# Patient Record
Sex: Female | Born: 1960 | Race: White | Hispanic: No | Smoking: Never smoker
Health system: Southern US, Community
[De-identification: ages and names within clinical notes are randomized; demographics above are authoritative.]

## PROBLEM LIST (undated history)

## (undated) DIAGNOSIS — I10 Essential (primary) hypertension: Secondary | ICD-10-CM

## (undated) DIAGNOSIS — E119 Type 2 diabetes mellitus without complications: Principal | ICD-10-CM

## (undated) DIAGNOSIS — Z9119 Patient's noncompliance with other medical treatment and regimen: Secondary | ICD-10-CM

## (undated) DIAGNOSIS — J3089 Other allergic rhinitis: Secondary | ICD-10-CM

## (undated) DIAGNOSIS — T7840XA Allergy, unspecified, initial encounter: Secondary | ICD-10-CM

## (undated) DIAGNOSIS — E785 Hyperlipidemia, unspecified: Secondary | ICD-10-CM

## (undated) DIAGNOSIS — G709 Myoneural disorder, unspecified: Secondary | ICD-10-CM

## (undated) HISTORY — DX: Type 2 diabetes mellitus without complications: E11.9

## (undated) HISTORY — PX: CLEFT PALATE REPAIR: SUR1165

## (undated) HISTORY — DX: Patient's noncompliance with other medical treatment and regimen: Z91.19

## (undated) HISTORY — DX: Myoneural disorder, unspecified: G70.9

## (undated) HISTORY — PX: NASAL SEPTUM SURGERY: SHX37

## (undated) HISTORY — PX: TONSILLECTOMY AND ADENOIDECTOMY: SUR1326

## (undated) HISTORY — DX: Other allergic rhinitis: J30.89

## (undated) HISTORY — PX: CHOLECYSTECTOMY: SHX55

## (undated) HISTORY — DX: Morbid (severe) obesity due to excess calories: E66.01

## (undated) HISTORY — PX: TYMPANOSTOMY TUBE PLACEMENT: SHX32

## (undated) HISTORY — DX: Hyperlipidemia, unspecified: E78.5

## (undated) HISTORY — DX: Essential (primary) hypertension: I10

## (undated) HISTORY — DX: Allergy, unspecified, initial encounter: T78.40XA

---

## 1998-07-11 ENCOUNTER — Encounter: Payer: Self-pay | Admitting: General Surgery

## 1998-07-11 ENCOUNTER — Observation Stay (HOSPITAL_COMMUNITY): Admission: RE | Admit: 1998-07-11 | Discharge: 1998-07-12 | Payer: Self-pay | Admitting: General Surgery

## 1999-08-24 ENCOUNTER — Other Ambulatory Visit: Admission: RE | Admit: 1999-08-24 | Discharge: 1999-08-24 | Payer: Self-pay | Admitting: *Deleted

## 2001-03-14 ENCOUNTER — Encounter: Admission: RE | Admit: 2001-03-14 | Discharge: 2001-03-14 | Payer: Self-pay | Admitting: *Deleted

## 2001-03-14 ENCOUNTER — Encounter: Payer: Self-pay | Admitting: *Deleted

## 2002-01-19 ENCOUNTER — Other Ambulatory Visit: Admission: RE | Admit: 2002-01-19 | Discharge: 2002-01-19 | Payer: Self-pay | Admitting: *Deleted

## 2002-04-29 ENCOUNTER — Encounter: Admission: RE | Admit: 2002-04-29 | Discharge: 2002-04-29 | Payer: Self-pay | Admitting: *Deleted

## 2002-04-29 ENCOUNTER — Encounter: Payer: Self-pay | Admitting: *Deleted

## 2004-07-19 ENCOUNTER — Encounter: Admission: RE | Admit: 2004-07-19 | Discharge: 2004-07-19 | Payer: Self-pay | Admitting: Obstetrics and Gynecology

## 2007-05-29 ENCOUNTER — Encounter: Admission: RE | Admit: 2007-05-29 | Discharge: 2007-05-29 | Payer: Self-pay | Admitting: Family Medicine

## 2009-04-22 ENCOUNTER — Encounter: Admission: RE | Admit: 2009-04-22 | Discharge: 2009-04-22 | Payer: Self-pay | Admitting: Family Medicine

## 2010-05-23 ENCOUNTER — Encounter: Admission: RE | Admit: 2010-05-23 | Discharge: 2010-05-23 | Payer: Self-pay | Admitting: Family Medicine

## 2012-03-26 ENCOUNTER — Other Ambulatory Visit: Payer: Self-pay

## 2012-03-26 ENCOUNTER — Other Ambulatory Visit (HOSPITAL_COMMUNITY)
Admission: RE | Admit: 2012-03-26 | Discharge: 2012-03-26 | Disposition: A | Discharge status: Home / Self Care | Payer: 59 | Source: Ambulatory Visit

## 2012-03-26 DIAGNOSIS — Z1151 Encounter for screening for human papillomavirus (HPV): Secondary | ICD-10-CM | POA: Insufficient documentation

## 2012-03-26 DIAGNOSIS — Z124 Encounter for screening for malignant neoplasm of cervix: Secondary | ICD-10-CM | POA: Insufficient documentation

## 2012-03-26 LAB — HM PAP SMEAR: HM Pap smear: NEGATIVE

## 2012-10-10 ENCOUNTER — Ambulatory Visit
Admission: RE | Admit: 2012-10-10 | Discharge: 2012-10-10 | Disposition: A | Discharge status: Home / Self Care | Payer: 59 | Source: Ambulatory Visit

## 2012-10-10 ENCOUNTER — Other Ambulatory Visit: Payer: Self-pay

## 2012-10-10 DIAGNOSIS — S99922A Unspecified injury of left foot, initial encounter: Secondary | ICD-10-CM

## 2013-04-14 ENCOUNTER — Other Ambulatory Visit: Payer: Self-pay

## 2013-04-14 DIAGNOSIS — Z1231 Encounter for screening mammogram for malignant neoplasm of breast: Secondary | ICD-10-CM

## 2013-04-16 ENCOUNTER — Ambulatory Visit (INDEPENDENT_AMBULATORY_CARE_PROVIDER_SITE_OTHER): Payer: 59 | Admitting: Podiatry

## 2013-04-16 ENCOUNTER — Encounter: Payer: Self-pay | Admitting: Podiatry

## 2013-04-16 VITALS — BP 148/84 | HR 90 | Resp 12 | Ht 61.0 in | Wt 201.0 lb

## 2013-04-16 DIAGNOSIS — L84 Corns and callosities: Secondary | ICD-10-CM

## 2013-04-16 NOTE — Progress Notes (Signed)
N- THICK SKIN  L- RT FOOT D- 3 WEEKS O- SUDDENLY C- WORSE A- PRESSURE T- N/A

## 2013-04-17 NOTE — Progress Notes (Signed)
Subjective:     Patient ID: Tammy Holloway, female   DOB: 1960/09/29, 52 y.o.   MRN: [id number]  HPI patient has lesions on the bottom of the right foot that are sore and she cannot take care of herself   Review of Systems     Objective:   Physical Exam  Nursing note and vitals reviewed. Constitutional: She is oriented to person, place, and time.  Cardiovascular: Intact distal pulses.   Musculoskeletal: Normal range of motion.  Neurological: She is oriented to person, place, and time.  Skin: Skin is warm.   Lesion formation both feet secondary to bone trama    Assessment:    lesions of both feet present    Plan:     Debridement of lesions both feet with no iatrogenic bleeding noted

## 2013-05-15 ENCOUNTER — Ambulatory Visit: Payer: 59

## 2013-05-19 ENCOUNTER — Ambulatory Visit
Admission: RE | Admit: 2013-05-19 | Discharge: 2013-05-19 | Disposition: A | Discharge status: Home / Self Care | Payer: 59 | Source: Ambulatory Visit

## 2013-05-19 DIAGNOSIS — Z1231 Encounter for screening mammogram for malignant neoplasm of breast: Secondary | ICD-10-CM

## 2013-06-30 LAB — MICROALBUMIN, URINE: Microalb, Ur: 1.1

## 2014-05-26 ENCOUNTER — Other Ambulatory Visit: Payer: Self-pay

## 2014-05-26 DIAGNOSIS — Z1231 Encounter for screening mammogram for malignant neoplasm of breast: Secondary | ICD-10-CM

## 2014-05-28 ENCOUNTER — Ambulatory Visit
Admission: RE | Admit: 2014-05-28 | Discharge: 2014-05-28 | Disposition: A | Discharge status: Home / Self Care | Payer: 59 | Source: Ambulatory Visit

## 2014-05-28 DIAGNOSIS — Z1231 Encounter for screening mammogram for malignant neoplasm of breast: Secondary | ICD-10-CM

## 2014-08-24 ENCOUNTER — Encounter: Payer: Self-pay | Admitting: Podiatry

## 2014-08-24 ENCOUNTER — Ambulatory Visit (INDEPENDENT_AMBULATORY_CARE_PROVIDER_SITE_OTHER): Payer: 59 | Admitting: Podiatry

## 2014-08-24 DIAGNOSIS — L84 Corns and callosities: Secondary | ICD-10-CM

## 2014-08-25 NOTE — Progress Notes (Signed)
Subjective:     Patient ID: Tammy Holloway, female   DOB: [birth year], 54 y.o.   MRN: [id number]  HPI patient presents with severe lesions plantar aspect right foot that have been chronic and responding well to trimming   Review of Systems     Objective:   Physical Exam Neurovascular status intact with keratotic lesion sub-metatarsal right foot that's painful when pressed    Assessment:     Lesion secondary to bone pressure    Plan:     Debride painful lesions plantar aspect right with no iatrogenic bleeding noted

## 2014-11-04 LAB — BASIC METABOLIC PANEL: POTASSIUM: 4.6 mmol/L (ref 3.4–5.3)

## 2015-05-03 ENCOUNTER — Other Ambulatory Visit: Payer: Self-pay

## 2015-05-03 DIAGNOSIS — Z1231 Encounter for screening mammogram for malignant neoplasm of breast: Secondary | ICD-10-CM

## 2015-05-03 LAB — BASIC METABOLIC PANEL
BUN: 17 mg/dL (ref 4–21)
CREATININE: 0.7 mg/dL (ref <=1.1)
GLUCOSE: 131 mg/dL

## 2015-05-03 LAB — LIPID PANEL
Cholesterol: 209 mg/dL — AB (ref 0–200)
HDL: 54 mg/dL (ref 35–70)
LDL Cholesterol: 138 mg/dL
TRIGLYCERIDES: 87 mg/dL (ref 40–160)

## 2015-05-03 LAB — HEMOGLOBIN A1C: Hemoglobin A1C: 6.7

## 2015-06-07 ENCOUNTER — Ambulatory Visit
Admission: RE | Admit: 2015-06-07 | Discharge: 2015-06-07 | Disposition: A | Discharge status: Home / Self Care | Payer: 59 | Source: Ambulatory Visit

## 2015-06-07 DIAGNOSIS — Z1231 Encounter for screening mammogram for malignant neoplasm of breast: Secondary | ICD-10-CM

## 2015-12-28 ENCOUNTER — Ambulatory Visit (INDEPENDENT_AMBULATORY_CARE_PROVIDER_SITE_OTHER): Payer: 59 | Admitting: Family Medicine

## 2015-12-28 ENCOUNTER — Encounter: Payer: Self-pay | Admitting: Family Medicine

## 2015-12-28 VITALS — BP 131/81 | HR 76 | Ht 60.5 in | Wt 201.9 lb

## 2015-12-28 DIAGNOSIS — J3089 Other allergic rhinitis: Secondary | ICD-10-CM

## 2015-12-28 DIAGNOSIS — IMO0001 Reserved for inherently not codable concepts without codable children: Secondary | ICD-10-CM

## 2015-12-28 DIAGNOSIS — Z8249 Family history of ischemic heart disease and other diseases of the circulatory system: Secondary | ICD-10-CM

## 2015-12-28 DIAGNOSIS — E119 Type 2 diabetes mellitus without complications: Secondary | ICD-10-CM

## 2015-12-28 DIAGNOSIS — M609 Myositis, unspecified: Secondary | ICD-10-CM

## 2015-12-28 DIAGNOSIS — E559 Vitamin D deficiency, unspecified: Secondary | ICD-10-CM | POA: Diagnosis not present

## 2015-12-28 DIAGNOSIS — Z8342 Family history of familial hypercholesterolemia: Secondary | ICD-10-CM

## 2015-12-28 DIAGNOSIS — Z823 Family history of stroke: Secondary | ICD-10-CM

## 2015-12-28 DIAGNOSIS — Z833 Family history of diabetes mellitus: Secondary | ICD-10-CM

## 2015-12-28 DIAGNOSIS — M791 Myalgia: Secondary | ICD-10-CM

## 2015-12-28 HISTORY — DX: Other allergic rhinitis: J30.89

## 2015-12-28 MED ORDER — GLIPIZIDE 5 MG PO TABS: 5.0000 mg | ORAL_TABLET | Freq: Two times a day (BID) | ORAL | Status: AC

## 2015-12-28 MED ORDER — METFORMIN HCL ER 500 MG PO TB24: 1000.0000 mg | ORAL_TABLET | Freq: Every day | ORAL | Status: AC

## 2015-12-28 NOTE — Patient Instructions (Addendum)
FBS- every morning before meals it should be under 126 then you can check your 2 hours after largest meal the day and that should be under 180.   Keep a log and write it down and bring in each office visit  Obtain fasting blood work in near future at your convenience and then have a follow-up office visit with me within 5-7 days      Complementary and Alternative Medical Therapies for Diabetes Complementary and alternative medicines are health care practices or products that are not always accepted as part of routine medicine. Complementary medicine is used along with routine medicine (medical therapy). Alternative medicine can sometimes be used instead of routine medicine. Some people use these methods to treat diabetes. While some of these therapies may be effective, others may not be. Some may even be harmful. Patients using these methods need to tell their caregiver. It is important to let your caregivers know what you are doing. Some of these therapies are discussed below. For more information, talk with your caregiver. THERAPIES Acupuncture Acupuncture is done by a professional who inserts needles into certain points on the skin. Some scientists believe that this triggers the release of the body's natural painkillers. It has been shown to relieve long-term (chronic) pain. This may help patients with painful nerve damage caused by diabetes. Biofeedback Biofeedback helps a person become more aware of the body's response to pain. It also helps you learn to deal with the pain. This alternative therapy focuses on relaxation and stress-reduction techniques. Thinking of peaceful mental images (guided imagery) is one technique. Some people believe these images can ease their condition. MEDICATIONS Chromium Several studies report that chromium supplements may improve diabetes control. Chromium helps insulin improve its action. Research is not yet certain. Supplements have not been recommended or  approved. Caution is needed if you have kidney (renal) problems. Ginseng There are several types of ginseng plants. American ginseng is used for diabetes studies. Those studies have shown some glucose-lowering effects. Those effects have been seen with fasting and after-meal blood glucose levels. They have also been seen in A1c levels (average blood glucose levels over a 36-month period). More long-term studies are needed before recommendations for use of ginseng can be made. Magnesium Experts have studied the relationship between magnesium and diabetes for many years. But it is not yet fully understood. Studies suggest that a low amount of magnesium may make blood glucose control worse in type 2 diabetes. Research also shows that a low amount may contribute to certain diabetes complications. One study showed that people who consume more magnesium had less risk of type 2 diabetes. Eating whole grains, nuts, and green leafy vegetables raises the magnesium level. Vanadium Vanadium is a compound found in tiny amounts in plants and animals. Early studies showed that vanadium improved blood glucose levels in animals with type 1 and type 2 diabetes. One study found that when given vanadium, those with diabetes were able to decrease their insulin dosage. Researchers still need to learn how it works in the body to discover any side effects, and to find safe dosages. Cinnamon There have been a couple of studies that seem to indicate cinnamon decreases insulin resistance and increases insulin production. By doing so, it may lower blood glucose. Exact doses are unknown, but it may work best when used in combination with other diabetes medicines.   This information is not intended to replace advice given to you by your health care provider. Make sure you discuss any  questions you have with your health care provider.   Document Released: 04/01/2007 Document Revised: 08/27/2011 Document Reviewed: 04/14/2009 Elsevier  Interactive Patient Education 2016 [employer]       Diabetes and Exercise Exercising regularly is important. It is not just about losing weight. It has many health benefits, such as:  Improving your overall fitness, flexibility, and endurance.  Increasing your bone density.  Helping with weight control.  Decreasing your body fat.  Increasing your muscle strength.  Reducing stress and tension.  Improving your overall health. People with diabetes who exercise gain additional benefits because exercise:  Reduces appetite.  Improves the body's use of blood sugar (glucose).  Helps lower or control blood glucose.  Decreases blood pressure.  Helps control blood lipids (such as cholesterol and triglycerides).  Improves the body's use of the hormone insulin by:  Increasing the body's insulin sensitivity.  Reducing the body's insulin needs.  Decreases the risk for heart disease because exercising:  Lowers cholesterol and triglycerides levels.  Increases the levels of good cholesterol (such as high-density lipoproteins [HDL]) in the body.  Lowers blood glucose levels. YOUR ACTIVITY PLAN  Choose an activity that you enjoy, and set realistic goals. To exercise safely, you should begin practicing any new physical activity slowly, and gradually increase the intensity of the exercise over time. Your health care provider or diabetes educator can help create an activity plan that works for you. General recommendations include:  Encouraging children to engage in at least 60 minutes of physical activity each day.  Stretching and performing strength training exercises, such as yoga or weight lifting, at least 2 times per week.  Performing a total of at least 150 minutes of moderate-intensity exercise each week, such as brisk walking or water aerobics.  Exercising at least 3 days per week, making sure you allow no more than 2 consecutive days to pass without  exercising.  Avoiding long periods of inactivity (90 minutes or more). When you have to spend an extended period of time sitting down, take frequent breaks to walk or stretch. RECOMMENDATIONS FOR EXERCISING WITH TYPE 1 OR TYPE 2 DIABETES   Check your blood glucose before exercising. If blood glucose levels are greater than 240 mg/dL, check for urine ketones. Do not exercise if ketones are present.  Avoid injecting insulin into areas of the body that are going to be exercised. For example, avoid injecting insulin into:  The arms when playing tennis.  The legs when jogging.  Keep a record of:  Food intake before and after you exercise.  Expected peak times of insulin action.  Blood glucose levels before and after you exercise.  The type and amount of exercise you have done.  Review your records with your health care provider. Your health care provider will help you to develop guidelines for adjusting food intake and insulin amounts before and after exercising.  If you take insulin or oral hypoglycemic agents, watch for signs and symptoms of hypoglycemia. They include:  Dizziness.  Shaking.  Sweating.  Chills.  Confusion.  Drink plenty of water while you exercise to prevent dehydration or heat stroke. Body water is lost during exercise and must be replaced.  Talk to your health care provider before starting an exercise program to make sure it is safe for you. Remember, almost any type of activity is better than none.   This information is not intended to replace advice given to you by your health care provider. Make sure you discuss any questions  you have with your health care provider.   Document Released: 08/25/2003 Document Revised: 10/19/2014 Document Reviewed: 11/11/2012 Elsevier Interactive Patient Education [employer]

## 2015-12-28 NOTE — Progress Notes (Signed)
Carlye Grippe, D.O. Primary care at [facility]   Subjective:    Chief Complaint  Patient presents with  . Establish Care   New pt, here to establish care.   HPI: Tammy Holloway is a pleasant 55 y.o. female who presents to [facility] at [facility] today to become established. Patient's prior PCP was Dr Farris Has of [facility] [place].   She also had tried to go see a "holistic practitioner" Becky Augusta, MD.   Patient admittedly would like to treat diseases through natural herbs and essential oils. But states that Dr. Asencion Islam was too expensive and did not know enough about essential oils/herbs and patient could not continue to go.  Patient was born in [place] and recently moved here from [place].   Been under a lot of stress lately.  Her dad died in 2022-11-23 and she did going up to [place] to see her mom a lot. No suicidal or homicidal ideations.    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 She states that working out however makes her feel weak and worsens her energy level.  She was told by an orthopedic or other sports medicine type physician in the past to avoid exercise or working out at all because she "caused so much damage to her joints and muscles" when she body built so hard in her 54s, that now her body can't take it.  Patient went off thyroid medicines couple years ago because she takes natural herbs of lemongrass and others.  DM for 11-12 yrs.   does not really check her sugars much.  She feels medications are bad for her and that they cause her to feel nauseated and gives her loose stools.  History of medical noncompliance     Past Medical History  Diagnosis Date  . Allergy   . Neuromuscular disorder [facility])       Past Surgical History  Procedure Laterality Date  . Tympanostomy tube placement    . Cleft palate repair    .  Tonsillectomy and adenoidectomy    . Nasal septum surgery    . Cholecystectomy        Family History  Problem Relation Age of Onset  . Hypertension Mother   . COPD Mother   . Alcohol abuse Father   . Cancer Father     esophageal  . Heart attack Father   . Diabetes Father   . Hyperlipidemia Father   . Stroke Father   . Cancer Paternal Grandmother     skin  . Heart attack Paternal Grandmother   . Stroke Paternal Grandmother       History  Drug Use No  ,    History  Alcohol Use No  ,    History  Smoking status  . Never Smoker   Smokeless tobacco  . Never Used  ,     History  Sexual Activity  . Sexual Activity: Yes  . Birth Control/ Protection: None      Patient's Medications  New Prescriptions   No medications on file  Previous Medications   ACTIPHYTE OF LEMONGRASS LIQD    12 drops by Does not apply route daily.   CHOLECALCIFEROL (VITAMIN D) 1000 UNITS TABLET    Take 2,000 Units by mouth daily.   COCONUT OIL PO    Take 1  application by mouth daily.   FLUOCINONIDE-EMOLLIENT (LIDEX-E) 0.05 % CREAM    Apply topically 2 (two) times daily.   GLIPIZIDE (GLUCOTROL) 5 MG TABLET    Take 5 mg by mouth 2 (two) times daily before a meal.   METFORMIN (GLUCOPHAGE) 500 MG TABLET    Take 2 tablets by mouth 2 (two) times daily.   PROBIOTIC PRODUCT (PROBIOTIC DAILY PO)    Take 1 tablet by mouth daily.  Modified Medications   No medications on file  Discontinued Medications   METFORMIN (GLUCOPHAGE) 500 MG TABLET    Take 500 mg by mouth 2 (two) times daily with a meal.     Latex and Volmax   Immunization History  Administered Date(s) Administered  . Tdap 05/03/2015   Review of Systems:   ( Completed via Adult Medical History Intake form today ) General:   Denies fever, chills, appetite changes, unexplained weight loss.  Optho/Auditory:   Denies visual changes, blurred vision/LOV, ringing in ears/ diff hearing Respiratory:   Denies SOB, DOE, cough, wheezing.    Cardiovascular:   Denies chest pain, palpitations, new onset peripheral edema  Gastrointestinal:   Denies nausea, vomiting, diarrhea.  Genitourinary:    Denies dysuria, increased frequency, flank pain.  Endocrine:     Denies hot or cold intolerance, polyuria, polydipsia. Musculoskeletal:  Denies unexplained myalgias, joint swelling, arthralgias, gait problems.  Skin:  Denies rash, suspicious lesions or new/ changes in moles Neurological:    Denies dizziness, syncope, unexplained weakness, lightheadedness, numbness  Psychiatric/Behavioral:   Denies mood changes, suicidal or homicidal ideations, hallucinations    Objective:   Blood pressure 131/81, pulse 76, height 5' 0.5" (1.537 m), weight 201 lb 14.4 oz (91.581 kg). Body mass index is 38.77 kg/(m^2).  General: Well Developed, well nourished, and in no acute distress.  Neuro: Alert and oriented x3, extra-ocular muscles intact, sensation grossly intact.  HEENT: Normocephalic, atraumatic, pupils equal round reactive to light, neck supple, no gross masses, no carotid bruits, no JVD apprec Skin: no gross suspicious lesions or rashes  Cardiac: Regular rate and rhythm, no murmurs rubs or gallops.  Respiratory: Essentially clear to auscultation bilaterally. Not using accessory muscles, speaking in full sentences.  Abdominal: Soft, not grossly distended Musculoskeletal: Ambulates w/o diff, FROM * 4 ext.  Vasc: less 2 sec cap RF, warm and pink  Psych:  No HI/SI, judgement and insight good.    Impression and Recommendations:    1. Diabetes mellitus without complication (HCC)   2. Morbid obesity, unspecified obesity type (HCC)   3. Generalized myalgias and myositis   4. Vitamin D deficiency   5. Environmental and seasonal allergies   6. Family history of heart attack ( F, GF)   7. Family history of diabetes mellitus in father   24. Family history of stroke or transient ischemic attack in father   27. Family history of high cholesterol (F)    10. Family history of hypertension in mother    FBS- every morning before meals it should be under 126 then you can check your 2 hours after largest meal the day and that should be under 180.   Keep a log and write it down and bring in each office visit  Obtain fasting blood work in near future at your convenience and then have a follow-up office visit with me within 5-7 days  Made it clear to patient that I did not specialize in complementary or alternative medicine but I am more than happy to try  to work with her.  I explained my knowledge of using essential oils for treatment of chronic disease is null, and that I would not have time to do research for her.  I did advise her to go online to look for these types of physicians in the area if she would so choose.     Pt was in the office today for 40+ minutes, with over 50% time spent in face to face counseling of various medical concerns and in coordination of care  The patient was counseled, risk factors were discussed, anticipatory guidance given.  Gross side effects, risk and benefits, and alternatives of medications discussed with patient.  Patient is aware that all medications have potential side effects and we are unable to predict every side effect or drug-drug interaction that may occur.  Expresses verbal understanding and consents to current therapy plan and treatment regimen.  Please see AVS handed out to patient at the end of our visit for further patient instructions/ counseling done pertaining to today's office visit.    Note: This document was prepared using Dragon voice recognition software and may include unintentional dictation errors.

## 2015-12-30 ENCOUNTER — Other Ambulatory Visit (INDEPENDENT_AMBULATORY_CARE_PROVIDER_SITE_OTHER): Payer: 59

## 2015-12-30 DIAGNOSIS — Z0000 Encounter for general adult medical examination without abnormal findings: Secondary | ICD-10-CM

## 2015-12-30 DIAGNOSIS — E119 Type 2 diabetes mellitus without complications: Secondary | ICD-10-CM | POA: Diagnosis not present

## 2015-12-31 LAB — LIPID PANEL
CHOLESTEROL: 191 mg/dL (ref 125–200)
HDL: 49 mg/dL (ref >=46)
LDL Cholesterol: 121 mg/dL (ref <130)
TRIGLYCERIDES: 104 mg/dL (ref <150)
Total CHOL/HDL Ratio: 3.9 Ratio (ref <=5.0)
VLDL: 21 mg/dL (ref <30)

## 2015-12-31 LAB — CBC WITH DIFFERENTIAL/PLATELET
BASOS ABS: 0 {cells}/uL (ref 0–200)
Basophils Relative: 0 %
EOS PCT: 2 %
Eosinophils Absolute: 138 cells/uL (ref 15–500)
HCT: 41.7 % (ref 35.0–45.0)
HEMOGLOBIN: 13.8 g/dL (ref 11.7–15.5)
LYMPHS PCT: 22 %
Lymphs Abs: 1518 cells/uL (ref 850–3900)
MCH: 28.2 pg (ref 27.0–33.0)
MCHC: 33.1 g/dL (ref 32.0–36.0)
MCV: 85.3 fL (ref 80.0–100.0)
MPV: 9.6 fL (ref 7.5–12.5)
Monocytes Absolute: 690 cells/uL (ref 200–950)
Monocytes Relative: 10 %
NEUTROS PCT: 66 %
Neutro Abs: 4554 cells/uL (ref 1500–7800)
Platelets: 274 10*3/uL (ref 140–400)
RBC: 4.89 MIL/uL (ref 3.80–5.10)
RDW: 14.2 % (ref 11.0–15.0)
WBC: 6.9 10*3/uL (ref 3.8–10.8)

## 2015-12-31 LAB — COMPREHENSIVE METABOLIC PANEL
ALBUMIN: 4 g/dL (ref 3.6–5.1)
ALT: 19 U/L (ref 6–29)
AST: 14 U/L (ref 10–35)
Alkaline Phosphatase: 87 U/L (ref 33–130)
BILIRUBIN TOTAL: 0.6 mg/dL (ref 0.2–1.2)
BUN: 14 mg/dL (ref 7–25)
CALCIUM: 8.8 mg/dL (ref 8.6–10.4)
CHLORIDE: 104 mmol/L (ref 98–110)
CO2: 28 mmol/L (ref 20–31)
CREATININE: 0.65 mg/dL (ref 0.50–1.05)
GLUCOSE: 158 mg/dL — AB (ref 65–99)
Potassium: 4.7 mmol/L (ref 3.5–5.3)
SODIUM: 140 mmol/L (ref 135–146)
Total Protein: 6.3 g/dL (ref 6.1–8.1)

## 2015-12-31 LAB — TSH: TSH: 2.13 mIU/L

## 2015-12-31 LAB — HEMOGLOBIN A1C
HEMOGLOBIN A1C: 7.4 % — AB (ref <5.7)
MEAN PLASMA GLUCOSE: 166 mg/dL

## 2015-12-31 LAB — VITAMIN D 25 HYDROXY (VIT D DEFICIENCY, FRACTURES): Vit D, 25-Hydroxy: 35 ng/mL (ref 30–100)

## 2016-01-06 ENCOUNTER — Ambulatory Visit (INDEPENDENT_AMBULATORY_CARE_PROVIDER_SITE_OTHER): Payer: 59 | Admitting: Family Medicine

## 2016-01-06 ENCOUNTER — Encounter: Payer: Self-pay | Admitting: Family Medicine

## 2016-01-06 VITALS — BP 136/83 | HR 68 | Resp 15 | Wt 203.0 lb

## 2016-01-06 DIAGNOSIS — E119 Type 2 diabetes mellitus without complications: Secondary | ICD-10-CM | POA: Diagnosis not present

## 2016-01-06 DIAGNOSIS — E785 Hyperlipidemia, unspecified: Secondary | ICD-10-CM | POA: Diagnosis not present

## 2016-01-06 DIAGNOSIS — Z91199 Patient's noncompliance with other medical treatment and regimen due to unspecified reason: Secondary | ICD-10-CM | POA: Insufficient documentation

## 2016-01-06 DIAGNOSIS — I10 Essential (primary) hypertension: Secondary | ICD-10-CM

## 2016-01-06 DIAGNOSIS — Z9119 Patient's noncompliance with other medical treatment and regimen: Secondary | ICD-10-CM

## 2016-01-06 HISTORY — DX: Essential (primary) hypertension: I10

## 2016-01-06 HISTORY — DX: Patient's noncompliance with other medical treatment and regimen: Z91.19

## 2016-01-06 HISTORY — DX: Type 2 diabetes mellitus without complications: E11.9

## 2016-01-06 HISTORY — DX: Hyperlipidemia, unspecified: E78.5

## 2016-01-06 HISTORY — DX: Patient's noncompliance with other medical treatment and regimen due to unspecified reason: Z91.199

## 2016-01-06 HISTORY — DX: Morbid (severe) obesity due to excess calories: E66.01

## 2016-01-06 NOTE — Progress Notes (Signed)
Subjective:    Chief Complaint  Patient presents with  . Follow-up    HPI: Tammy Holloway is a 55 y.o. female who presents to [facility] at [facility] today to discuss labs and her plan on how to treat her diseases naturally through oils/herbs etc.    She has a history of diabetes and admittedly, has not been eating right or exercising.  She brings me in a piece of paper with fasting blood sugars and 2 hour postprandials. Her fasting blood sugars run in the 130s to 140s. Her 2 hour postprandials 180s to 190s. She denies any symptoms polyuria polydipsia, numbness tingling, visual problems.   She does not want to go up on any of her meds today, or start any new ones and wants me to work with her she can try self treatment through "natural" ways. She brings in a bunch of sheets on essential oils and they're therapeutic properties as well as articles on essential oils she would like me to preview.     Past Medical History  Diagnosis Date  . Allergy   . Neuromuscular disorder ([place])   . Essential hypertension 01/06/2016  . Diabetes mellitus without complication ([place]) 12/27/1973  . HLD (hyperlipidemia) 01/06/2016  . Morbid obesity ([place]) 01/06/2016  . Personal history of noncompliance with medical treatment and regimen 01/06/2016     Past Surgical History  Procedure Laterality Date  . Tympanostomy tube placement    . Cleft palate repair    . Tonsillectomy and adenoidectomy    . Nasal septum surgery    . Cholecystectomy       Family History  Problem Relation Age of Onset  . Hypertension Mother   . COPD Mother   . Alcohol abuse Father   . Cancer Father     esophageal  . Heart attack Father   . Diabetes Father   . Hyperlipidemia Father   . Stroke Father   . Cancer Paternal Grandmother     skin  . Heart attack Paternal Grandmother   . Stroke Paternal Grandmother      History  Drug Use No  ,  History  Alcohol Use No  ,  History  Smoking  status  . Never Smoker   Smokeless tobacco  . Never Used  ,  History  Sexual Activity  . Sexual Activity: Yes  . Birth Control/ Protection: None      Current Outpatient Prescriptions on File Prior to Visit  Medication Sig Dispense Refill  . ACTIPHYTE OF L[place]ONGR[place] LIQD 12 drops by Does not apply route daily.    . cholecalciferol (VITAMIN D) 1000 UNITS tablet Take 2,000 Units by mouth daily.    . COCONUT OIL PO Take 1 application by mouth daily.    . fluocinonide-emollient (LIDEX-E) 0.05 % cream Apply topically 2 (two) times daily.    Marland Kitchen glipiZIDE (GLUCOTROL) 5 MG tablet Take 1 tablet (5 mg total) by mouth 2 (two) times daily before a meal. 180 tablet 0  . metFORMIN (GLUCOPHAGE XR) 500 MG 24 hr tablet Take 2 tablets (1,000 mg total) by mouth daily with breakfast. 90 tablet 0  . Probiotic Product (PROBIOTIC DAILY PO) Take 1 tablet by mouth daily.     No current facility-administered medications on file prior to visit.      Allergies  Allergen Reactions  . Latex Dermatitis  . Volmax [Albuterol] Other (See Comments)      Review of Systems:  (  Completed via adult medical history intake form today ) General:  Denies fever, chills, appetite changes, unexplained weight loss.  Respiratory: Denies SOB, DOE, cough, wheezing.  Cardiovascular: Denies chest pain, palpitations.  Gastrointestinal: Denies nausea, vomiting, diarrhea, abdominal pain.  Genitourinary: Denies dysuria, increased frequency, flank pain. Endocrine: Denies hot or cold intolerance, polyuria, polydipsia. Musculoskeletal: Denies myalgias, back pain, joint swelling, arthralgias, gait problems.  Skin: Denies pallor, rash, suspicious lesions.  Neurological: Denies dizziness, seizures, syncope, unexplained weakness, lightheadedness, numbness and headaches.  Psychiatric/Behavioral: Denies mood changes, suicidal or homicidal ideations, hallucinations, sleep disturbances.    Objective:    Blood pressure 136/83, pulse  68, resp. rate 15, weight 203 lb (92.08 kg), SpO2 97 %. Body mass index is 38.98 kg/(m^2). General: Well Developed, well nourished, appropriate for stated age.  Neuro: Alert and oriented, extra-ocular muscles intact HEENT: Normocephalic, atraumatic, pupils equal round reactive, neck supple Skin: Warm, pink and dry, no gross rash. Cardiac: rate regular Respiratory: Not using accessory muscles, speaking in full sentences-unlabored. Vascular:  cap RF less 2 sec. Psych: No SI/HI, Insight and judgement goodLDL at goal in diabetics         Recent Results (from the past 2160 hour(s))  CBC with Differential/Platelet     Status: None   Collection Time: 12/30/15  8:37 AM  Result Value Ref Range   WBC 6.9 3.8 - 10.8 K/uL   RBC 4.89 3.80 - 5.10 MIL/uL   Hemoglobin 13.8 11.7 - 15.5 g/dL   HCT 41.7 35.0 - 45.0 %   MCV 85.3 80.0 - 100.0 fL   MCH 28.2 27.0 - 33.0 pg   MCHC 33.1 32.0 - 36.0 g/dL   RDW 14.2 11.0 - 15.0 %   Platelets 274 140 - 400 K/uL   MPV 9.6 7.5 - 12.5 fL   Neutro Abs 4554 1500 - 7800 cells/uL   Lymphs Abs 1518 850 - 3900 cells/uL   Monocytes Absolute 690 200 - 950 cells/uL   Eosinophils Absolute 138 15 - 500 cells/uL   Basophils Absolute 0 0 - 200 cells/uL   Neutrophils Relative % 66 %   Lymphocytes Relative 22 %   Monocytes Relative 10 %   Eosinophils Relative 2 %   Basophils Relative 0 %   Smear Review Criteria for review not met     Comment: ** Please note change in unit of measure and reference range(s). **  Comprehensive metabolic panel     Status: Abnormal   Collection Time: 12/30/15  8:37 AM  Result Value Ref Range   Sodium 140 135 - 146 mmol/L   Potassium 4.7 3.5 - 5.3 mmol/L   Chloride 104 98 - 110 mmol/L   CO2 28 20 - 31 mmol/L   Glucose, Bld 158 (H) 65 - 99 mg/dL   BUN 14 7 - 25 mg/dL   Creat 0.65 0.50 - 1.05 mg/dL    Comment:   For patients > or = 55 years of age: The upper reference limit for Creatinine is approximately 13% higher for people  identified as African-American.      Total Bilirubin 0.6 0.2 - 1.2 mg/dL   Alkaline Phosphatase 87 33 - 130 U/L   AST 14 10 - 35 U/L   ALT 19 6 - 29 U/L   Total Protein 6.3 6.1 - 8.1 g/dL   Albumin 4.0 3.6 - 5.1 g/dL   Calcium 8.8 8.6 - 10.4 mg/dL  TSH     Status: None   Collection Time: 12/30/15  8:37  AM  Result Value Ref Range   TSH 2.13 mIU/L    Comment:   Reference Range   > or = 20 Years  0.40-4.50   Pregnancy Range First trimester  0.26-2.66 Second trimester 0.55-2.73 Third trimester  0.43-2.91     Hemoglobin A1c     Status: Abnormal   Collection Time: 12/30/15  8:37 AM  Result Value Ref Range   Hgb A1c MFr Bld 7.4 (H) <5.7 %    Comment:   For someone without known diabetes, a hemoglobin A1c value of 6.5% or greater indicates that they may have diabetes and this should be confirmed with a follow-up test.   For someone with known diabetes, a value <7% indicates that their diabetes is well controlled and a value greater than or equal to 7% indicates suboptimal control. A1c targets should be individualized based on duration of diabetes, age, comorbid conditions, and other considerations.   Currently, no consensus exists for use of hemoglobin A1c for diagnosis of diabetes for children.      Mean Plasma Glucose 166 mg/dL  VITAMIN D 25 Hydroxy (Vit-D Deficiency, Fractures)     Status: None   Collection Time: 12/30/15  8:37 AM  Result Value Ref Range   Vit D, 25-Hydroxy 35 30 - 100 ng/mL    Comment: Vitamin D Status           25-OH Vitamin D        Deficiency                <20 ng/mL        Insufficiency         20 - 29 ng/mL        Optimal             > or = 30 ng/mL   For 25-OH Vitamin D testing on patients on D2-supplementation and patients for whom quantitation of D2 and D3 fractions is required, the QuestAssureD 25-OH VIT D, (D2,D3), LC/MS/MS is recommended: order code [id number] (patients > 2 yrs).   Lipid panel     Status: None   Collection Time: 12/30/15   8:37 AM  Result Value Ref Range   Cholesterol 191 125 - 200 mg/dL   Triglycerides 104 <150 mg/dL   HDL 49 >=46 mg/dL   Total CHOL/HDL Ratio 3.9 <=5.0 Ratio   VLDL 21 <30 mg/dL   LDL Cholesterol 121 <130 mg/dL    Comment:   Total Cholesterol/HDL Ratio:CHD Risk                        Coronary Heart Disease Risk Table                                        Men       Women          1/2 Average Risk              3.4        3.3              Average Risk              5.0        4.4           2X Average Risk              9.6  7.1           3X Average Risk             23.4       11.0 Use the calculated Patient Ratio above and the CHD Risk table  to determine the patient's CHD Risk.         Impression and Recommendations:    The patient was counselled, risk factors were discussed, anticipatory guidance given.   1. Diabetes mellitus without complication ([place])   2. HLD (hyperlipidemia)   3. Essential hypertension   4. Morbid obesity, unspecified obesity type ([place])   5. Personal history of noncompliance with medical treatment and regimen    - Patient declines medication (metformin) increase for her diabetes since A1c at 7.4.    -Understands risks associated with this and may lead to faster progression of disease  -Patient declines blood pressure medicines and understands goal 130/80 or less.  -Patient declines medicines for her hyperlipidemia and I explained LDL goal should be less than 100.  -Patient understands risks associated with her decision to go "natural ".   I very strongly recommend she engage in routine aerobic activity as well as prudent diet and explained that weight loss would be the best "natural "way to treat her diseases.  -Patient will agree to 3 month follow-up   Please see AVS handed out to patient at the end of our visit for further patient instructions/ counseling done pertaining to today's office visit.  Gross side effects, risk and benefits, and  alternatives of medications discussed with patient.  Patient is aware that all medications have potential side effects and we are unable to predict every sideeffect or drug-drug interaction that may occur.  Expresses verbal understanding and consents to current therapy plan and treatment regiment.  Note: This document was prepared using Dragon voice recognition software and may include unintentional dictation errors.

## 2016-01-06 NOTE — Patient Instructions (Signed)
Patient declines going up on her Glucophage per my recommendations today she will work with essential oils over the next 3 months and we will recheck if A1c is not under 7.0 3 months, she agrees to go up on the medicine   Hypertension Hypertension is another name for high blood pressure. High blood pressure forces your heart to work harder to pump blood. A blood pressure reading has two numbers, which includes a higher number over a lower number (example: 110/72). HOME CARE   Have your blood pressure rechecked by your doctor.  Only take medicine as told by your doctor. Follow the directions carefully. The medicine does not work as well if you skip doses. Skipping doses also puts you at risk for problems.  Do not smoke.  Monitor your blood pressure at home as told by your doctor. GET HELP IF:  You think you are having a reaction to the medicine you are taking.  You have repeat headaches or feel dizzy.  You have puffiness (swelling) in your ankles.  You have trouble with your vision. GET HELP RIGHT AWAY IF:   You get a very bad headache and are confused.  You feel weak, numb, or faint.  You get chest or belly (abdominal) pain.  You throw up (vomit).  You cannot breathe very well. MAKE SURE YOU:   Understand these instructions.  Will watch your condition.  Will get help right away if you are not doing well or get worse.   This information is not intended to replace advice given to you by your health care provider. Make sure you discuss any questions you have with your health care provider.   Document Released: 11/21/2007 Document Revised: 06/09/2013 Document Reviewed: 03/27/2013 Elsevier Interactive Patient Education 2016 Elsevier Inc.   Diabetes and Foot Care Diabetes may cause you to have problems because of poor blood supply (circulation) to your feet and legs. This may cause the skin on your feet to become thinner, break easier, and heal more slowly. Your skin may  become dry, and the skin may peel and crack. You may also have nerve damage in your legs and feet causing decreased feeling in them. You may not notice minor injuries to your feet that could lead to infections or more serious problems. Taking care of your feet is one of the most important things you can do for yourself.  HOME CARE INSTRUCTIONS  Wear shoes at all times, even in the house. Do not go barefoot. Bare feet are easily injured.  Check your feet daily for blisters, cuts, and redness. If you cannot see the bottom of your feet, use a mirror or ask someone for help.  Wash your feet with warm water (do not use hot water) and mild soap. Then pat your feet and the areas between your toes until they are completely dry. Do not soak your feet as this can dry your skin.  Apply a moisturizing lotion or petroleum jelly (that does not contain alcohol and is unscented) to the skin on your feet and to dry, brittle toenails. Do not apply lotion between your toes.  Trim your toenails straight across. Do not dig under them or around the cuticle. File the edges of your nails with an emery board or nail file.  Do not cut corns or calluses or try to remove them with medicine.  Wear clean socks or stockings every day. Make sure they are not too tight. Do not wear knee-high stockings since they may decrease blood  flow to your legs.  Wear shoes that fit properly and have enough cushioning. To break in new shoes, wear them for just a few hours a day. This prevents you from injuring your feet. Always look in your shoes before you put them on to be sure there are no objects inside.  Do not cross your legs. This may decrease the blood flow to your feet.  If you find a minor scrape, cut, or break in the skin on your feet, keep it and the skin around it clean and dry. These areas may be cleansed with mild soap and water. Do not cleanse the area with peroxide, alcohol, or iodine.  When you remove an adhesive  bandage, be sure not to damage the skin around it.  If you have a wound, look at it several times a day to make sure it is healing.  Do not use heating pads or hot water bottles. They may burn your skin. If you have lost feeling in your feet or legs, you may not know it is happening until it is too late.  Make sure your health care provider performs a complete foot exam at least annually or more often if you have foot problems. Report any cuts, sores, or bruises to your health care provider immediately. SEEK MEDICAL CARE IF:   You have an injury that is not healing.  You have cuts or breaks in the skin.  You have an ingrown nail.  You notice redness on your legs or feet.  You feel burning or tingling in your legs or feet.  You have pain or cramps in your legs and feet.  Your legs or feet are numb.  Your feet always feel cold. SEEK IMMEDIATE MEDICAL CARE IF:   There is increasing redness, swelling, or pain in or around a wound.  There is a red line that goes up your leg.  Pus is coming from a wound.  You develop a fever or as directed by your health care provider.  You notice a bad smell coming from an ulcer or wound.   This information is not intended to replace advice given to you by your health care provider. Make sure you discuss any questions you have with your health care provider.   Document Released: 06/01/2000 Document Revised: 02/04/2013 Document Reviewed: 11/11/2012 Elsevier Interactive Patient Education 2016 [employer].     Diabetes and Exercise Exercising regularly is important. It is not just about losing weight. It has many health benefits, such as:  Improving your overall fitness, flexibility, and endurance.  Increasing your bone density.  Helping with weight control.  Decreasing your body fat.  Increasing your muscle strength.  Reducing stress and tension.  Improving your overall health. People with diabetes who exercise gain additional  benefits because exercise:  Reduces appetite.  Improves the body's use of blood sugar (glucose).  Helps lower or control blood glucose.  Decreases blood pressure.  Helps control blood lipids (such as cholesterol and triglycerides).  Improves the body's use of the hormone insulin by:  Increasing the body's insulin sensitivity.  Reducing the body's insulin needs.  Decreases the risk for heart disease because exercising:  Lowers cholesterol and triglycerides levels.  Increases the levels of good cholesterol (such as high-density lipoproteins [HDL]) in the body.  Lowers blood glucose levels. YOUR ACTIVITY PLAN  Choose an activity that you enjoy, and set realistic goals. To exercise safely, you should begin practicing any new physical activity slowly, and gradually increase the  intensity of the exercise over time. Your health care provider or diabetes educator can help create an activity plan that works for you. General recommendations include:  Encouraging children to engage in at least 60 minutes of physical activity each day.  Stretching and performing strength training exercises, such as yoga or weight lifting, at least 2 times per week.  Performing a total of at least 150 minutes of moderate-intensity exercise each week, such as brisk walking or water aerobics.  Exercising at least 3 days per week, making sure you allow no more than 2 consecutive days to pass without exercising.  Avoiding long periods of inactivity (90 minutes or more). When you have to spend an extended period of time sitting down, take frequent breaks to walk or stretch. RECOMMENDATIONS FOR EXERCISING WITH TYPE 1 OR TYPE 2 DIABETES   Check your blood glucose before exercising. If blood glucose levels are greater than 240 mg/dL, check for urine ketones. Do not exercise if ketones are present.  Avoid injecting insulin into areas of the body that are going to be exercised. For example, avoid injecting insulin  into:  The arms when playing tennis.  The legs when jogging.  Keep a record of:  Food intake before and after you exercise.  Expected peak times of insulin action.  Blood glucose levels before and after you exercise.  The type and amount of exercise you have done.  Review your records with your health care provider. Your health care provider will help you to develop guidelines for adjusting food intake and insulin amounts before and after exercising.  If you take insulin or oral hypoglycemic agents, watch for signs and symptoms of hypoglycemia. They include:  Dizziness.  Shaking.  Sweating.  Chills.  Confusion.  Drink plenty of water while you exercise to prevent dehydration or heat stroke. Body water is lost during exercise and must be replaced.  Talk to your health care provider before starting an exercise program to make sure it is safe for you. Remember, almost any type of activity is better than none.   This information is not intended to replace advice given to you by your health care provider. Make sure you discuss any questions you have with your health care provider.   Document Released: 08/25/2003 Document Revised: 10/19/2014 Document Reviewed: 11/11/2012 Elsevier Interactive Patient Education [employer]2016 Elsevier Inc.

## 2016-01-07 ENCOUNTER — Encounter: Payer: Self-pay | Admitting: Family Medicine

## 2016-01-07 DIAGNOSIS — Z823 Family history of stroke: Secondary | ICD-10-CM | POA: Insufficient documentation

## 2016-01-07 DIAGNOSIS — Z833 Family history of diabetes mellitus: Secondary | ICD-10-CM | POA: Insufficient documentation

## 2016-01-07 DIAGNOSIS — IMO0001 Reserved for inherently not codable concepts without codable children: Secondary | ICD-10-CM | POA: Insufficient documentation

## 2016-01-07 DIAGNOSIS — Z8342 Family history of familial hypercholesterolemia: Secondary | ICD-10-CM | POA: Insufficient documentation

## 2016-01-07 DIAGNOSIS — Z8249 Family history of ischemic heart disease and other diseases of the circulatory system: Secondary | ICD-10-CM | POA: Insufficient documentation

## 2016-04-10 ENCOUNTER — Ambulatory Visit: Payer: 59 | Admitting: Family Medicine

## 2016-05-18 ENCOUNTER — Other Ambulatory Visit: Payer: Self-pay | Admitting: Family Medicine

## 2016-05-18 DIAGNOSIS — Z1231 Encounter for screening mammogram for malignant neoplasm of breast: Secondary | ICD-10-CM

## 2016-06-13 ENCOUNTER — Ambulatory Visit: Payer: 59

## 2016-07-02 ENCOUNTER — Ambulatory Visit: Payer: 59

## 2016-09-12 DIAGNOSIS — E119 Type 2 diabetes mellitus without complications: Secondary | ICD-10-CM | POA: Diagnosis not present

## 2016-09-12 DIAGNOSIS — E785 Hyperlipidemia, unspecified: Secondary | ICD-10-CM | POA: Diagnosis not present

## 2016-09-12 DIAGNOSIS — Z7984 Long term (current) use of oral hypoglycemic drugs: Secondary | ICD-10-CM | POA: Diagnosis not present

## 2016-09-12 DIAGNOSIS — E559 Vitamin D deficiency, unspecified: Secondary | ICD-10-CM | POA: Diagnosis not present

## 2016-09-12 DIAGNOSIS — E039 Hypothyroidism, unspecified: Secondary | ICD-10-CM | POA: Diagnosis not present

## 2016-12-28 DIAGNOSIS — E785 Hyperlipidemia, unspecified: Secondary | ICD-10-CM | POA: Diagnosis not present

## 2016-12-28 DIAGNOSIS — E119 Type 2 diabetes mellitus without complications: Secondary | ICD-10-CM | POA: Diagnosis not present

## 2017-03-01 DIAGNOSIS — T6391XA Toxic effect of contact with unspecified venomous animal, accidental (unintentional), initial encounter: Secondary | ICD-10-CM | POA: Diagnosis not present

## 2017-03-27 ENCOUNTER — Ambulatory Visit
Admission: RE | Admit: 2017-03-27 | Discharge: 2017-03-27 | Disposition: A | Discharge status: Home / Self Care | Payer: 59 | Source: Ambulatory Visit | Attending: Allergy | Admitting: Allergy

## 2017-03-27 ENCOUNTER — Other Ambulatory Visit: Payer: Self-pay | Admitting: Allergy

## 2017-03-27 DIAGNOSIS — R05 Cough: Secondary | ICD-10-CM

## 2017-03-27 DIAGNOSIS — R059 Cough, unspecified: Secondary | ICD-10-CM

## 2017-03-27 DIAGNOSIS — J309 Allergic rhinitis, unspecified: Secondary | ICD-10-CM | POA: Diagnosis not present

## 2017-04-19 DIAGNOSIS — Z1231 Encounter for screening mammogram for malignant neoplasm of breast: Secondary | ICD-10-CM | POA: Diagnosis not present

## 2017-04-26 DIAGNOSIS — E119 Type 2 diabetes mellitus without complications: Secondary | ICD-10-CM | POA: Diagnosis not present

## 2017-05-23 ENCOUNTER — Other Ambulatory Visit (HOSPITAL_COMMUNITY)
Admission: RE | Admit: 2017-05-23 | Discharge: 2017-05-23 | Disposition: A | Discharge status: Home / Self Care | Payer: 59 | Source: Ambulatory Visit | Attending: Family Medicine | Admitting: Family Medicine

## 2017-05-23 ENCOUNTER — Other Ambulatory Visit: Payer: Self-pay | Admitting: Family Medicine

## 2017-05-23 DIAGNOSIS — R109 Unspecified abdominal pain: Secondary | ICD-10-CM | POA: Diagnosis not present

## 2017-05-23 DIAGNOSIS — Z01419 Encounter for gynecological examination (general) (routine) without abnormal findings: Secondary | ICD-10-CM | POA: Diagnosis not present

## 2017-05-23 DIAGNOSIS — Z1211 Encounter for screening for malignant neoplasm of colon: Secondary | ICD-10-CM | POA: Diagnosis not present

## 2017-05-23 DIAGNOSIS — Z0000 Encounter for general adult medical examination without abnormal findings: Secondary | ICD-10-CM | POA: Diagnosis not present

## 2017-05-23 DIAGNOSIS — E119 Type 2 diabetes mellitus without complications: Secondary | ICD-10-CM | POA: Diagnosis not present

## 2017-05-24 LAB — CYTOLOGY - PAP
DIAGNOSIS: NEGATIVE
HPV (WINDOPATH): NOT DETECTED

## 2017-10-03 DIAGNOSIS — R05 Cough: Secondary | ICD-10-CM | POA: Diagnosis not present

## 2017-10-03 DIAGNOSIS — E119 Type 2 diabetes mellitus without complications: Secondary | ICD-10-CM | POA: Diagnosis not present

## 2017-10-03 DIAGNOSIS — E785 Hyperlipidemia, unspecified: Secondary | ICD-10-CM | POA: Diagnosis not present

## 2017-11-30 DIAGNOSIS — S61212A Laceration without foreign body of right middle finger without damage to nail, initial encounter: Secondary | ICD-10-CM | POA: Diagnosis not present

## 2018-02-03 DIAGNOSIS — E785 Hyperlipidemia, unspecified: Secondary | ICD-10-CM | POA: Diagnosis not present

## 2018-02-03 DIAGNOSIS — E119 Type 2 diabetes mellitus without complications: Secondary | ICD-10-CM | POA: Diagnosis not present

## 2018-02-03 DIAGNOSIS — R5383 Other fatigue: Secondary | ICD-10-CM | POA: Diagnosis not present

## 2018-05-19 DIAGNOSIS — Z1231 Encounter for screening mammogram for malignant neoplasm of breast: Secondary | ICD-10-CM | POA: Diagnosis not present

## 2018-05-22 DIAGNOSIS — E113293 Type 2 diabetes mellitus with mild nonproliferative diabetic retinopathy without macular edema, bilateral: Secondary | ICD-10-CM | POA: Diagnosis not present

## 2018-06-05 DIAGNOSIS — Z0000 Encounter for general adult medical examination without abnormal findings: Secondary | ICD-10-CM | POA: Diagnosis not present

## 2018-06-05 DIAGNOSIS — E119 Type 2 diabetes mellitus without complications: Secondary | ICD-10-CM | POA: Diagnosis not present

## 2018-06-05 DIAGNOSIS — Z1211 Encounter for screening for malignant neoplasm of colon: Secondary | ICD-10-CM | POA: Diagnosis not present

## 2019-08-15 IMAGING — CR DG CHEST 2V
2 series · 2 of 2 positions shown · non-contrast
Comparison: None

CLINICAL DATA: Nonproductive cough for 3 weeks, diabetes mellitus,
essential hypertension

EXAM:
CHEST  2 VIEW

[w chest pa]
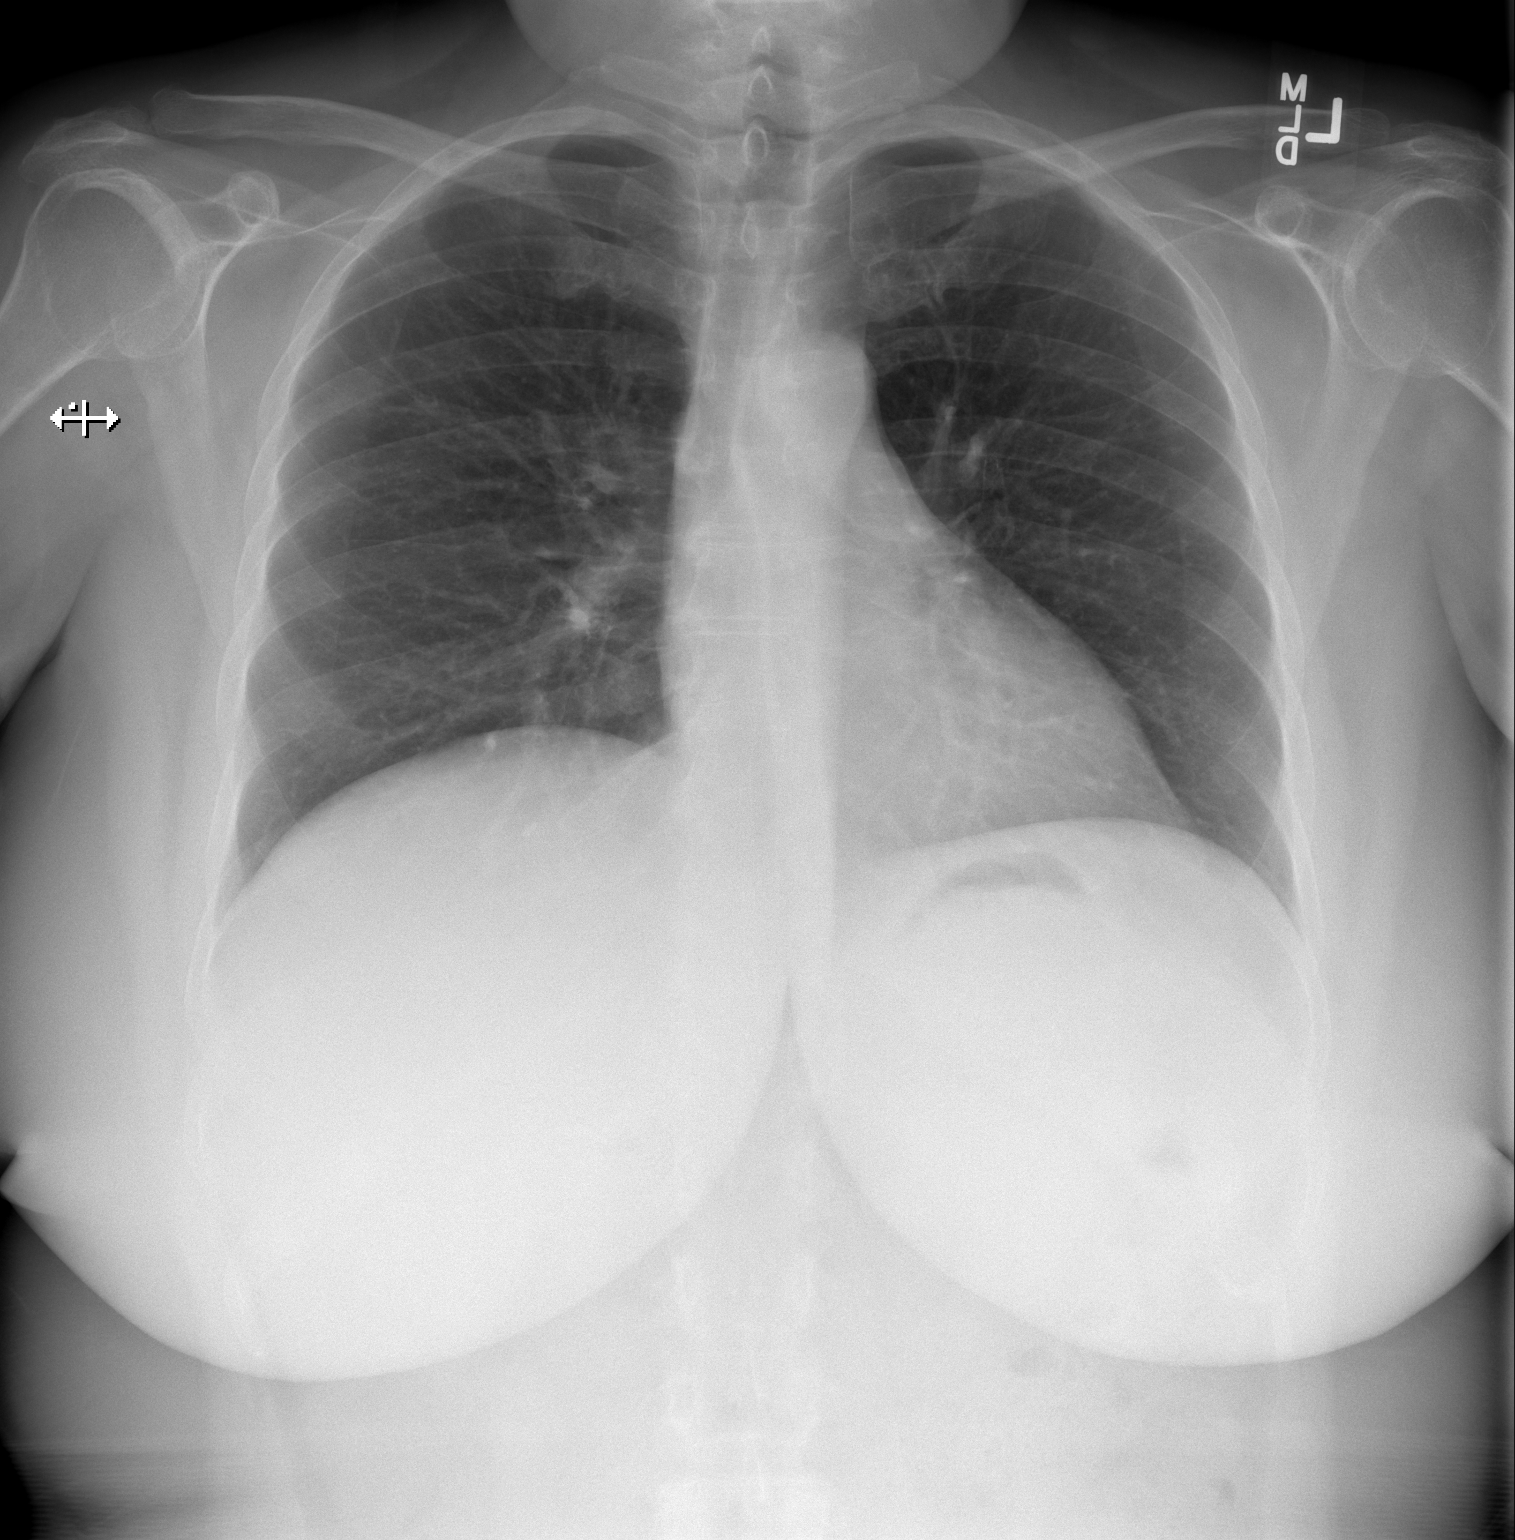

[w chest lat]
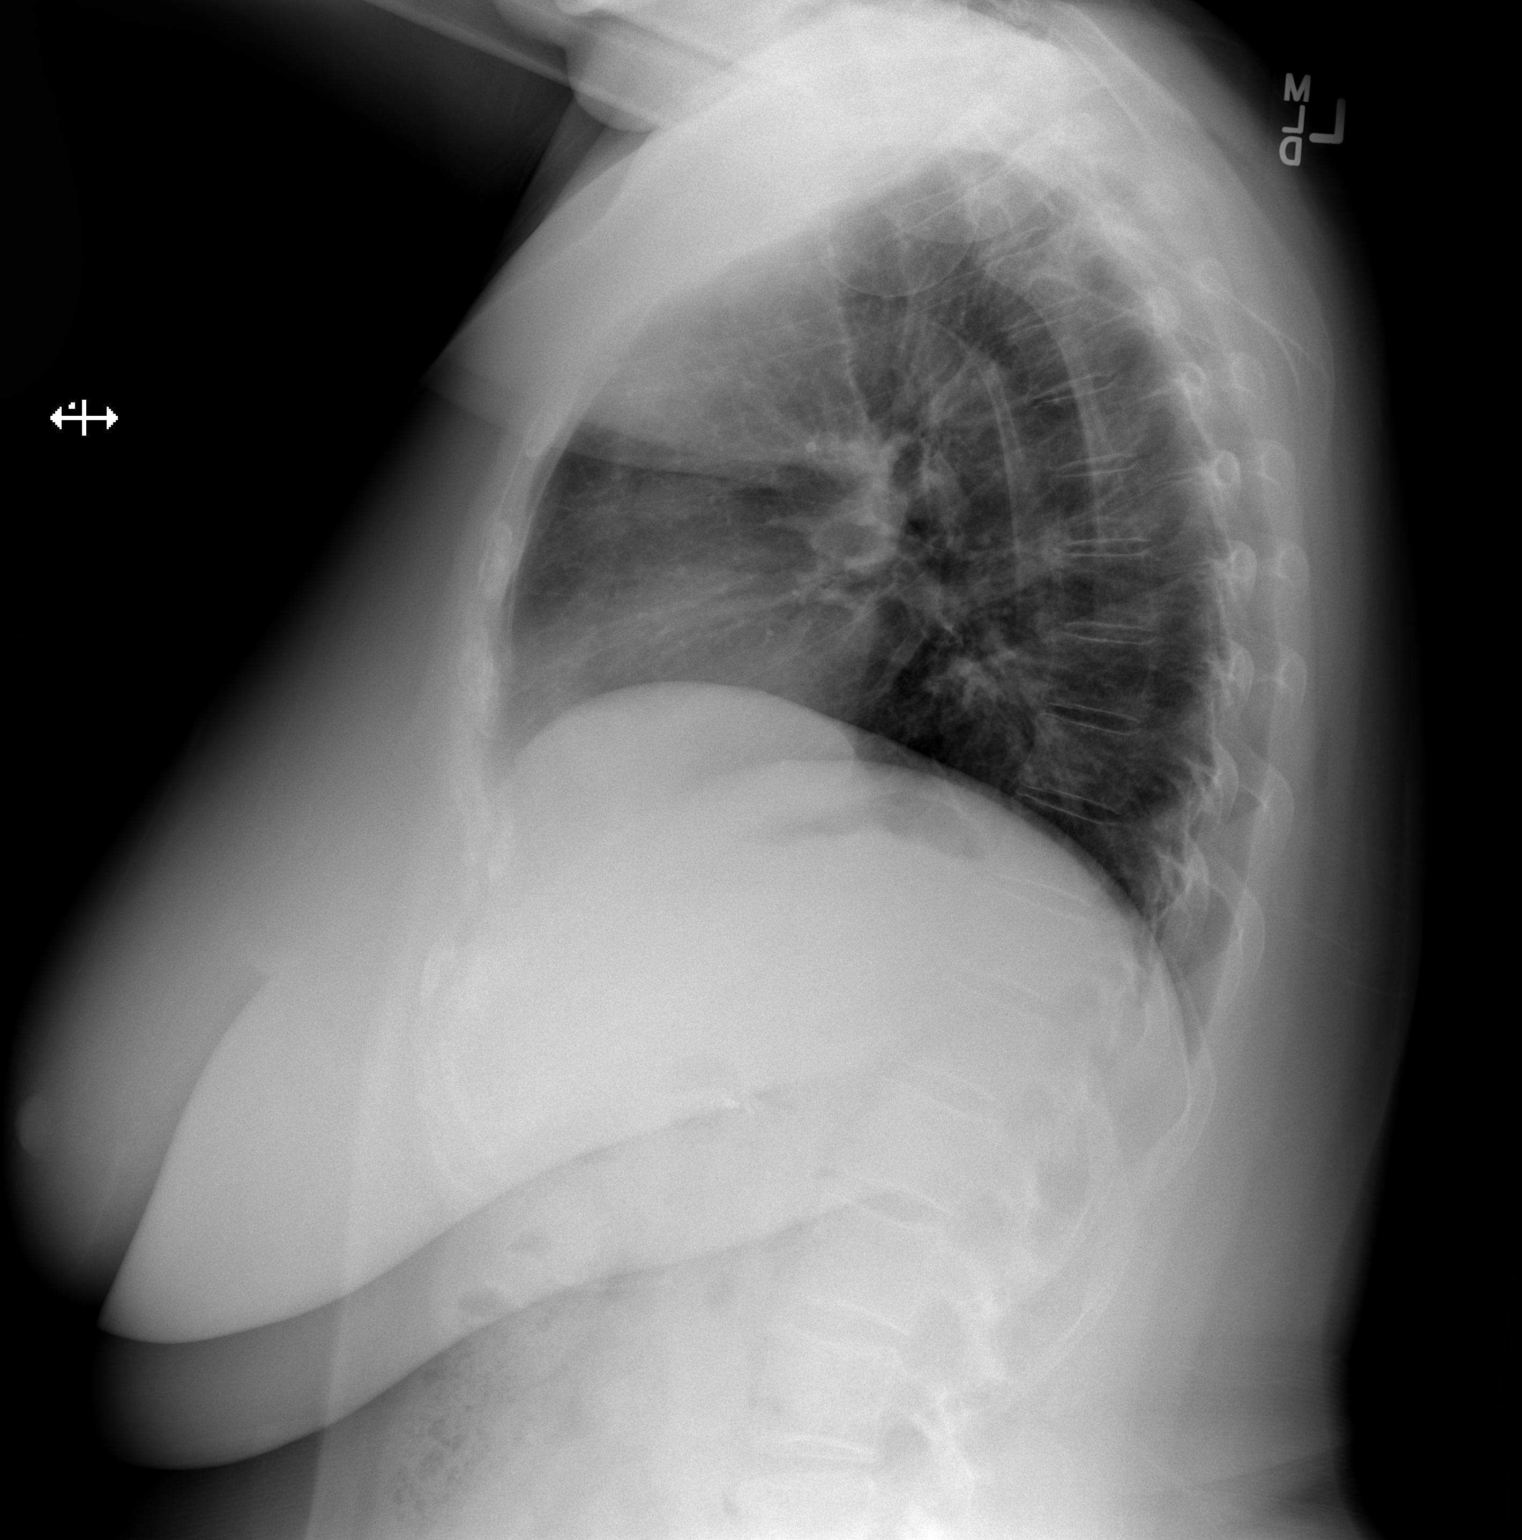

[2 of 2 positions shown; findings below may reference images not displayed]

FINDINGS: Normal heart size, mediastinal contours, and pulmonary vascularity.

Lungs clear.

No pleural effusion or pneumothorax.

Bones unremarkable.
IMPRESSION: Normal exam.

## 2019-11-09 ENCOUNTER — Ambulatory Visit (INDEPENDENT_AMBULATORY_CARE_PROVIDER_SITE_OTHER): Payer: 59 | Admitting: Podiatry

## 2019-11-09 ENCOUNTER — Encounter: Payer: Self-pay | Admitting: Podiatry

## 2019-11-09 ENCOUNTER — Other Ambulatory Visit: Payer: Self-pay | Admitting: Podiatry

## 2019-11-09 ENCOUNTER — Ambulatory Visit (INDEPENDENT_AMBULATORY_CARE_PROVIDER_SITE_OTHER): Payer: 59

## 2019-11-09 ENCOUNTER — Other Ambulatory Visit: Payer: Self-pay

## 2019-11-09 VITALS — Temp 98.0°F

## 2019-11-09 DIAGNOSIS — L84 Corns and callosities: Secondary | ICD-10-CM

## 2019-11-09 DIAGNOSIS — M79671 Pain in right foot: Secondary | ICD-10-CM

## 2019-11-09 DIAGNOSIS — M779 Enthesopathy, unspecified: Secondary | ICD-10-CM | POA: Diagnosis not present

## 2019-11-11 NOTE — Progress Notes (Signed)
Subjective:   Patient ID: Tammy Holloway, female   DOB: 59 y.o.   MRN: [id number]   HPI Patient presents stating she is been having a lot of pain in her forefoot right that is been present for a few months.  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  Patient does not smoke likes to be active   Review of Systems  All other systems reviewed and are negative.       Objective:  Physical Exam Vitals and nursing note reviewed.  Constitutional:      Appearance: She is well-developed.  Pulmonary:     Effort: Pulmonary effort is normal.  Musculoskeletal:        General: Normal range of motion.  Skin:    General: Skin is warm.  Neurological:     Mental Status: She is alert.     Neurovascular found to be intact muscle strength adequate range of motion within normal limits.  Patient is found to have inflammation pain around the third MPJ right with fluid buildup around the joint surface and is noted to have plantar keratotic lesions extending across the second third and fourth metatarsals with numerous small lucent cord noted lesions.  Patient has good digital perfusion      Assessment:  Probability for inflammatory capsulitis of the third MPJ right along with keratotic lesions plantar right     Plan:  H&P reviewed both conditions and today I went ahead and I did a sterile forefoot anesthetic drop of the right foot and then went ahead and aspirated the third MPJ getting out a small amount of clear fluid injected quarter cc dexamethasone Kenalog and then went ahead and using sterile sharp debridement debrided all lesions on the right foot and advised on thick bottom shoes.  Patient will be seen back as needed  X-ray indicates no signs of stress fracture arthritis with possible mild narrowing of the third MPJ right

## 2019-12-16 DIAGNOSIS — B308 Other viral conjunctivitis: Secondary | ICD-10-CM | POA: Diagnosis not present

## 2019-12-28 DIAGNOSIS — E1169 Type 2 diabetes mellitus with other specified complication: Secondary | ICD-10-CM | POA: Diagnosis not present

## 2019-12-28 DIAGNOSIS — E039 Hypothyroidism, unspecified: Secondary | ICD-10-CM | POA: Diagnosis not present

## 2019-12-28 DIAGNOSIS — Z0000 Encounter for general adult medical examination without abnormal findings: Secondary | ICD-10-CM | POA: Diagnosis not present

## 2019-12-28 DIAGNOSIS — E785 Hyperlipidemia, unspecified: Secondary | ICD-10-CM | POA: Diagnosis not present

## 2020-04-01 ENCOUNTER — Other Ambulatory Visit: Payer: Self-pay | Admitting: Family

## 2020-04-01 ENCOUNTER — Telehealth: Payer: Self-pay | Admitting: Family

## 2020-04-01 DIAGNOSIS — U071 COVID-19: Secondary | ICD-10-CM

## 2020-04-01 DIAGNOSIS — I10 Essential (primary) hypertension: Secondary | ICD-10-CM

## 2020-04-01 DIAGNOSIS — E119 Type 2 diabetes mellitus without complications: Secondary | ICD-10-CM

## 2020-04-01 NOTE — Telephone Encounter (Signed)
Called to Discuss with patient about Covid symptoms and the use of the monoclonal antibody infusion for those with mild to moderate Covid symptoms and at a high risk of hospitalization.     Pt appears to qualify for this infusion due to co-morbid conditions and/or a member of an at-risk group in accordance with the FDA Emergency Use Authorization.    Tammy Holloway started experiencing symptoms on 03/25/2020. She was referred from [facility]. Tested positive for COVID at [facility]. Qualifying risk factors include morbid obesity, hyperlipidemia, and diabetes without complication. Currently having fever, dry heaves, cough, decreased appetite and diarrhea   Discussed the risks, benefits, and potential financial information with Tammy Holloway regarding monoclonal antibody therapy.  She wishes to continue with treatment at this time.  Hello Tammy Holloway,   You have been scheduled to receive the monoclonal antibody therapy at [facility]: 04/02/20 at 10:30 am   If you have been tested outside of a [facility] - you MUST bring a copy of your positive test with you the morning of your appointment. You may take a photo of this and upload to your MyChart portal or have the testing facility fax the result to [phone]    The address for the infusion clinic site is:  --GPS address is [address] - the parking is located near [employer] building where you will see  COVID19 Infusion feather banner marking the entrance to parking.   (see photos below)            --Enter into the 2nd entrance where the "wave, flag banner" is at the road. Turn into this 2nd entrance and immediately turn left to park in 1 of the 5 parking spots.   --Please stay in your car and call the desk for assistance inside [phone].   --Average time in department is roughly 2 hours for Regeneron treatment - this includes preparation of the medication, IV start and the required 1 hour monitoring  after the infusion.    Should you develop worsening shortness of breath, chest pain or severe breathing problems please do not wait for this appointment and go to the Emergency room for evaluation and treatment. You will undergo another oxygen screen before your infusion to ensure this is the best treatment option for you. There is a chance that the best decision may be to send you to the Emergency Room for evaluation at the time of your appointment.   The day of your visit you should: Marland Kitchen Get plenty of rest the night before and drink plenty of water . Eat a light meal/snack before coming and take your medications as prescribed  . Wear warm, comfortable clothes with a shirt that can roll-up over the elbow (will need IV start).  . Wear a mask  . Consider bringing some activity to help pass the time  Many commercial insurers are waiving bills related to COVID treatment however some have ranged from $300-640. We are starting to see some insurers send bills to patients later for the administration of the medication - we are learning more information but you may receive a bill after your appointment.  Please contact your insurance agent to discuss prior to your appointment if you would like further details about billing specific to your policy.    The CPT code is 2405819115 for your reference.    Marcos Eke, NP 04/01/2020 3:15 PM

## 2020-04-01 NOTE — Progress Notes (Signed)
I connected by phone with Tammy Holloway on 04/01/2020 at 3:16 PM to discuss the potential use of a new treatment for mild to moderate COVID-19 viral infection in non-hospitalized patients.  This patient is a 59 y.o. female that meets the FDA criteria for Emergency Use Authorization of COVID monoclonal antibody casirivimab/imdevimab or bamlanivimab/eteseviamb.  Has a (+) direct SARS-CoV-2 viral test result  Has mild or moderate COVID-19   Is NOT hospitalized due to COVID-19  Is within 10 days of symptom onset  Has at least one of the high risk factor(s) for progression to severe COVID-19 and/or hospitalization as defined in EUA.  Specific high risk criteria : BMI > 25, Diabetes and Cardiovascular disease or hypertension   I have spoken and communicated the following to the patient or parent/caregiver regarding COVID monoclonal antibody treatment:  1. FDA has authorized the emergency use for the treatment of mild to moderate COVID-19 in adults and pediatric patients with positive results of direct SARS-CoV-2 viral testing who are 13 years of age and older weighing at least 40 kg, and who are at high risk for progressing to severe COVID-19 and/or hospitalization.  2. The significant known and potential risks and benefits of COVID monoclonal antibody, and the extent to which such potential risks and benefits are unknown.  3. Information on available alternative treatments and the risks and benefits of those alternatives, including clinical trials.  4. Patients treated with COVID monoclonal antibody should continue to self-isolate and use infection control measures (e.g., wear mask, isolate, social distance, avoid sharing personal items, clean and disinfect "high touch" surfaces, and frequent handwashing) according to CDC guidelines.   5. The patient or parent/caregiver has the option to accept or refuse COVID monoclonal antibody treatment.  After reviewing this information with the patient,  the patient has agreed to receive one of the available covid 19 monoclonal antibodies and will be provided an appropriate fact sheet prior to infusion.   [name], FNP 04/01/2020 3:16 PM

## 2020-04-02 ENCOUNTER — Ambulatory Visit (HOSPITAL_COMMUNITY)
Admission: RE | Admit: 2020-04-02 | Discharge: 2020-04-02 | Disposition: A | Discharge status: Home / Self Care | Payer: 59 | Source: Ambulatory Visit | Attending: Pulmonary Disease | Admitting: Pulmonary Disease

## 2020-04-02 DIAGNOSIS — U071 COVID-19: Secondary | ICD-10-CM | POA: Diagnosis present

## 2020-04-02 DIAGNOSIS — E119 Type 2 diabetes mellitus without complications: Secondary | ICD-10-CM | POA: Insufficient documentation

## 2020-04-02 DIAGNOSIS — I10 Essential (primary) hypertension: Secondary | ICD-10-CM

## 2020-04-02 MED ORDER — FAMOTIDINE IN NACL 20-0.9 MG/50ML-% IV SOLN: 20.0000 mg | Freq: Once | INTRAVENOUS | Status: DC | PRN

## 2020-04-02 MED ORDER — METHYLPREDNISOLONE SODIUM SUCC 125 MG IJ SOLR: 125.0000 mg | Freq: Once | INTRAMUSCULAR | Status: DC | PRN

## 2020-04-02 MED ORDER — SODIUM CHLORIDE 0.9 % IV SOLN: INTRAVENOUS | Status: DC | PRN

## 2020-04-02 MED ORDER — ONDANSETRON HCL 4 MG/2ML IJ SOLN
4.0000 mg | Freq: Once | INTRAMUSCULAR | Status: AC
  Administered 2020-04-02: 4 mg via INTRAVENOUS
  Filled 2020-04-02: qty 2

## 2020-04-02 MED ORDER — ALBUTEROL SULFATE HFA 108 (90 BASE) MCG/ACT IN AERS: 2.0000 | INHALATION_SPRAY | Freq: Once | RESPIRATORY_TRACT | Status: DC | PRN

## 2020-04-02 MED ORDER — SODIUM CHLORIDE 0.9 % IV SOLN: Freq: Once | INTRAVENOUS | Status: AC

## 2020-04-02 MED ORDER — EPINEPHRINE 0.3 MG/0.3ML IJ SOAJ: 0.3000 mg | Freq: Once | INTRAMUSCULAR | Status: DC | PRN

## 2020-04-02 MED ORDER — DIPHENHYDRAMINE HCL 50 MG/ML IJ SOLN: 50.0000 mg | Freq: Once | INTRAMUSCULAR | Status: DC | PRN

## 2020-04-02 NOTE — Progress Notes (Signed)
  Diagnosis: COVID-19  Physician:Dr. Wright   Procedure: Covid Infusion Clinic Med: bamlanivimab\etesevimab infusion - Provided patient with bamlanimivab\etesevimab fact sheet for patients, parents and caregivers prior to infusion.  Complications: No immediate complications noted.  Discharge: Discharged home   Tammy Holloway  B Kelsha Older 04/02/2020   

## 2020-04-02 NOTE — Discharge Instructions (Signed)

## 2020-08-24 ENCOUNTER — Encounter: Payer: Self-pay | Admitting: Podiatry

## 2020-08-24 ENCOUNTER — Other Ambulatory Visit: Payer: Self-pay

## 2020-08-24 ENCOUNTER — Ambulatory Visit (INDEPENDENT_AMBULATORY_CARE_PROVIDER_SITE_OTHER): Payer: 59 | Admitting: Podiatry

## 2020-08-24 DIAGNOSIS — Q828 Other specified congenital malformations of skin: Secondary | ICD-10-CM

## 2020-08-24 NOTE — Progress Notes (Signed)
Subjective:   Patient ID: Tammy Holloway, female   DOB: 60 y.o.   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

## 2023-02-15 ENCOUNTER — Encounter: Payer: Self-pay | Admitting: Podiatry

## 2023-02-15 ENCOUNTER — Ambulatory Visit: Payer: Commercial Managed Care - HMO | Admitting: Podiatry

## 2023-02-15 DIAGNOSIS — L600 Ingrowing nail: Secondary | ICD-10-CM | POA: Diagnosis not present

## 2023-02-15 DIAGNOSIS — Q828 Other specified congenital malformations of skin: Secondary | ICD-10-CM

## 2023-02-15 NOTE — Progress Notes (Signed)
Subjective:   Patient ID: Tammy Holloway, female   DOB: 61 y.o.   MRN: [id number]   HPI Patient presents stating she has these painful lesions on the bottom of both feet which can be aggravating and on the right big toe she has a spicule of nail which is aggravated and can be sore for her.  States she is try to take it out herself   ROS      Objective:  Physical Exam  Neurovascular status intact muscle strength adequate range of motion adequate patient found to have a spicule of nail irritative on the right hallux lateral border painful when pressed making shoe gear difficult and also has significant lesion formation plantar aspect both feet lucent core is painful when pressed     Assessment:  Ingrown toenail deformity right hallux lateral side along with chronic porokeratotic lesion bilateral     Plan:  H&P reviewed both conditions and recommended correction of the ingrown right patient wants this done and I went ahead today and I anesthetized 60 mg like Marcaine mixture sterile prep done using sterile instrumentation remove the lateral border exposed matrix applied sodium hydroxide followed by acetic acid lavage and sterile dressing.  Instructed on soaks leave dressing on 24 hours take it off earlier if needed.  I debrided lesions on both feet Neutra genic bleeding can be done routinely

## 2023-02-15 NOTE — Patient Instructions (Signed)
